# Patient Record
Sex: Female | Born: 1955 | Race: Black or African American | Hispanic: No | Smoking: Current every day smoker
Health system: Southern US, Community
[De-identification: ages and names within clinical notes are randomized; demographics above are authoritative.]

## PROBLEM LIST (undated history)

## (undated) DIAGNOSIS — I10 Essential (primary) hypertension: Secondary | ICD-10-CM

## (undated) DIAGNOSIS — E7800 Pure hypercholesterolemia, unspecified: Secondary | ICD-10-CM

## (undated) HISTORY — PX: VAGINAL HYSTERECTOMY: SUR661

## (undated) HISTORY — DX: Essential (primary) hypertension: I10

## (undated) HISTORY — PX: TONSILLECTOMY: SUR1361

## (undated) HISTORY — DX: Pure hypercholesterolemia, unspecified: E78.00

---

## 2011-03-05 ENCOUNTER — Other Ambulatory Visit: Payer: Self-pay | Admitting: Obstetrics and Gynecology

## 2011-03-05 DIAGNOSIS — Z139 Encounter for screening, unspecified: Secondary | ICD-10-CM

## 2011-03-15 ENCOUNTER — Ambulatory Visit (HOSPITAL_COMMUNITY): Payer: Self-pay

## 2011-03-30 ENCOUNTER — Ambulatory Visit (HOSPITAL_COMMUNITY): Payer: Self-pay

## 2011-03-30 ENCOUNTER — Other Ambulatory Visit: Payer: Self-pay | Admitting: Obstetrics and Gynecology

## 2011-03-30 ENCOUNTER — Ambulatory Visit (HOSPITAL_COMMUNITY)
Admission: RE | Admit: 2011-03-30 | Discharge: 2011-03-30 | Disposition: A | Discharge status: Home / Self Care | Payer: Self-pay | Source: Ambulatory Visit | Attending: Obstetrics and Gynecology | Admitting: Obstetrics and Gynecology

## 2011-03-30 DIAGNOSIS — Z1231 Encounter for screening mammogram for malignant neoplasm of breast: Secondary | ICD-10-CM | POA: Insufficient documentation

## 2011-03-30 DIAGNOSIS — Z139 Encounter for screening, unspecified: Secondary | ICD-10-CM

## 2021-08-29 DIAGNOSIS — J449 Chronic obstructive pulmonary disease, unspecified: Secondary | ICD-10-CM | POA: Diagnosis not present

## 2021-08-29 DIAGNOSIS — I10 Essential (primary) hypertension: Secondary | ICD-10-CM | POA: Diagnosis not present

## 2021-10-30 DIAGNOSIS — I10 Essential (primary) hypertension: Secondary | ICD-10-CM | POA: Diagnosis not present

## 2021-10-30 DIAGNOSIS — F17200 Nicotine dependence, unspecified, uncomplicated: Secondary | ICD-10-CM | POA: Diagnosis not present

## 2021-11-27 DIAGNOSIS — F17200 Nicotine dependence, unspecified, uncomplicated: Secondary | ICD-10-CM | POA: Diagnosis not present

## 2021-11-27 DIAGNOSIS — I10 Essential (primary) hypertension: Secondary | ICD-10-CM | POA: Diagnosis not present

## 2022-01-04 ENCOUNTER — Other Ambulatory Visit (HOSPITAL_COMMUNITY): Payer: Self-pay | Admitting: Internal Medicine

## 2022-01-04 DIAGNOSIS — Z1231 Encounter for screening mammogram for malignant neoplasm of breast: Secondary | ICD-10-CM

## 2022-01-04 DIAGNOSIS — I10 Essential (primary) hypertension: Secondary | ICD-10-CM | POA: Diagnosis not present

## 2022-01-04 DIAGNOSIS — J449 Chronic obstructive pulmonary disease, unspecified: Secondary | ICD-10-CM | POA: Diagnosis not present

## 2022-01-04 DIAGNOSIS — E782 Mixed hyperlipidemia: Secondary | ICD-10-CM | POA: Diagnosis not present

## 2022-01-04 DIAGNOSIS — Z0001 Encounter for general adult medical examination with abnormal findings: Secondary | ICD-10-CM | POA: Diagnosis not present

## 2022-01-04 DIAGNOSIS — F17210 Nicotine dependence, cigarettes, uncomplicated: Secondary | ICD-10-CM | POA: Diagnosis not present

## 2022-01-04 DIAGNOSIS — Z1389 Encounter for screening for other disorder: Secondary | ICD-10-CM | POA: Diagnosis not present

## 2022-01-25 ENCOUNTER — Ambulatory Visit (HOSPITAL_COMMUNITY)
Admission: RE | Admit: 2022-01-25 | Discharge: 2022-01-25 | Disposition: A | Discharge status: Home / Self Care | Payer: Medicare Other | Source: Ambulatory Visit | Attending: Internal Medicine | Admitting: Internal Medicine

## 2022-01-25 ENCOUNTER — Ambulatory Visit (HOSPITAL_COMMUNITY): Payer: Self-pay

## 2022-01-25 DIAGNOSIS — E785 Hyperlipidemia, unspecified: Secondary | ICD-10-CM | POA: Diagnosis not present

## 2022-01-25 DIAGNOSIS — Z0001 Encounter for general adult medical examination with abnormal findings: Secondary | ICD-10-CM | POA: Diagnosis not present

## 2022-01-25 DIAGNOSIS — R7303 Prediabetes: Secondary | ICD-10-CM | POA: Diagnosis not present

## 2022-01-25 DIAGNOSIS — Z1159 Encounter for screening for other viral diseases: Secondary | ICD-10-CM | POA: Diagnosis not present

## 2022-01-25 DIAGNOSIS — Z1231 Encounter for screening mammogram for malignant neoplasm of breast: Secondary | ICD-10-CM | POA: Insufficient documentation

## 2022-01-25 DIAGNOSIS — I10 Essential (primary) hypertension: Secondary | ICD-10-CM | POA: Diagnosis not present

## 2022-03-06 DIAGNOSIS — J449 Chronic obstructive pulmonary disease, unspecified: Secondary | ICD-10-CM | POA: Diagnosis not present

## 2022-03-06 DIAGNOSIS — I10 Essential (primary) hypertension: Secondary | ICD-10-CM | POA: Diagnosis not present

## 2022-03-20 DIAGNOSIS — H6092 Unspecified otitis externa, left ear: Secondary | ICD-10-CM | POA: Diagnosis not present

## 2022-03-20 DIAGNOSIS — J309 Allergic rhinitis, unspecified: Secondary | ICD-10-CM | POA: Diagnosis not present

## 2022-04-17 ENCOUNTER — Encounter: Payer: Self-pay | Admitting: *Deleted

## 2022-04-19 DIAGNOSIS — E785 Hyperlipidemia, unspecified: Secondary | ICD-10-CM | POA: Diagnosis not present

## 2022-04-19 DIAGNOSIS — I10 Essential (primary) hypertension: Secondary | ICD-10-CM | POA: Diagnosis not present

## 2022-05-17 ENCOUNTER — Ambulatory Visit (INDEPENDENT_AMBULATORY_CARE_PROVIDER_SITE_OTHER): Payer: Medicare Other | Admitting: Obstetrics & Gynecology

## 2022-05-17 ENCOUNTER — Encounter: Payer: Self-pay | Admitting: Obstetrics & Gynecology

## 2022-05-17 VITALS — BP 128/69 | HR 85 | Wt 172.6 lb

## 2022-05-17 DIAGNOSIS — Z01419 Encounter for gynecological examination (general) (routine) without abnormal findings: Secondary | ICD-10-CM

## 2022-05-17 NOTE — Progress Notes (Signed)
   WELL-WOMAN EXAMINATION Patient name: Sherry House MRN [id number]  Date of birth: 1956-04-24 Chief Complaint:   Gynecologic Exam  History of Present Illness:   Sherry House is a 66 y.o. G2P2002 PM, PH female being seen today for a routine well-woman exam.  Today she notes no acute complaints or concerns from a gynecologic perspective. Prior hysterectomy completed vaginally due to uterine fibroids.   No LMP recorded. Patient has had a hysterectomy.   Last pap not sure.  Last mammogram: 01/2022- [facility]. I Last colonoscopy: in the process      No data to display            Review of Systems:   Pertinent items are noted in HPI Denies any headaches, blurred vision, fatigue, shortness of breath, chest pain, abdominal pain, bowel movements, urination, or intercourse unless otherwise stated above.  Pertinent History Reviewed:  Reviewed past medical,surgical, social and family history.  Reviewed problem list, medications and allergies. Physical Assessment:   Vitals:   05/17/22 1422  BP: 128/69  Pulse: 85  Weight: 172 lb 9.6 oz (78.3 kg)  There is no height or weight on file to calculate BMI.        Physical Examination:   General appearance - well appearing, and in no distress  Mental status - alert, oriented to person, place, and time  Psych:  She has a normal mood and affect  Skin - warm and dry, normal color, no suspicious lesions noted  Chest - effort normal, all lung fields clear to auscultation bilaterally  Heart - normal rate and regular rhythm  Neck:  midline trachea, no thyromegaly or nodules  Breasts - breasts appear normal, no suspicious masses, no skin or nipple changes or  axillary nodes  Abdomen - soft, nontender, nondistended, no masses or organomegaly  Pelvic - VULVA: normal appearing vulva with no masses, tenderness or lesions  VAGINA: normal appearing vagina with normal color and discharge, no lesions  Uterus and cervix surgically absent  ADNEXA: No  adnexal masses or tenderness noted.  Extremities:  No swelling or varicosities noted  Chaperone:  pt declined      Assessment & Plan:  1) Well-Woman Exam Pap not indicated due to prior hysterectomy for benign conditions Mammogram up to date  Meds: No orders of the defined types were placed in this encounter.   Follow-up: Return in about 2 years (around 05/17/2024) for prn or 2 yr annual.   Sherry Hidalgo, DO Attending Obstetrician & Gynecologist, [facility] for [employer], [facility]

## 2022-05-20 DIAGNOSIS — I10 Essential (primary) hypertension: Secondary | ICD-10-CM | POA: Diagnosis not present

## 2022-05-20 DIAGNOSIS — J449 Chronic obstructive pulmonary disease, unspecified: Secondary | ICD-10-CM | POA: Diagnosis not present

## 2022-06-11 ENCOUNTER — Encounter: Payer: Self-pay | Admitting: *Deleted

## 2022-06-11 NOTE — Patient Instructions (Signed)
  Procedure: Colonoscopy  Estimated body mass index is 27.25 kg/m as calculated from the following:   Height as of this encounter: 5\' 7"  (1.702 m).   Weight as of this encounter: 174 lb (78.9 kg).   Have you had a colonoscopy before?  no  Do you have family history of colon cancer  no  Do you have a family history of polyps? yes  Previous colonoscopy with polyps removed?   Do you have a history colorectal cancer?   no  Are you diabetic?  no  Do you have a prosthetic or mechanical heart valve? no  Do you have a pacemaker/defibrillator?   no  Have you had endocarditis/atrial fibrillation?  no  Do you use supplemental oxygen/CPAP?  no  Have you had joint replacement within the last 12 months?  no  Do you tend to be constipated or have to use laxatives?     Do you have history of alcohol use? If yes, how much and how often.  no  Do you have history or are you using drugs? If yes, what do are you  using?  no  Have you ever had a stroke/heart attack?  no  Have you ever had a heart or other vascular stent placed,?no  Do you take weight loss medication? no  female patients,: have you had a hysterectomy? yes                              are you post menopausal?                                do you still have your menstrual cycle? no    Date of last menstrual period.   Do you take any blood-thinning medications such as: (Plavix, aspirin, Coumadin, Aggrenox, Brilinta, Xarelto, Eliquis, Pradaxa, Savaysa or Effient) no  If yes we need the name, milligram, dosage and who is prescribing doctor:               Current Outpatient Medications  Medication Sig Dispense Refill   albuterol (VENTOLIN HFA) 108 (90 Base) MCG/ACT inhaler SMARTSIG:1 Puff(s) By Mouth 4 Times Daily PRN     atorvastatin (LIPITOR) 10 MG tablet Take 10 mg by mouth daily.     gabapentin (NEURONTIN) 100 MG capsule Take 100 mg by mouth at bedtime.     loratadine (CLARITIN) 10 MG tablet Take 10 mg by mouth daily.      losartan (COZAAR) 50 MG tablet Take 50 mg by mouth daily.     Omega-3 1000 MG CAPS Take by mouth.     rosuvastatin (CRESTOR) 10 MG tablet Take 10 mg by mouth at bedtime.     No current facility-administered medications for this visit.    No Known Allergies

## 2022-06-20 DIAGNOSIS — J449 Chronic obstructive pulmonary disease, unspecified: Secondary | ICD-10-CM | POA: Diagnosis not present

## 2022-06-20 DIAGNOSIS — I10 Essential (primary) hypertension: Secondary | ICD-10-CM | POA: Diagnosis not present

## 2022-06-20 NOTE — Progress Notes (Signed)
LMTRC

## 2022-06-20 NOTE — Progress Notes (Signed)
ASA 2, ok to schedule.  

## 2022-07-02 ENCOUNTER — Encounter: Payer: Self-pay | Admitting: *Deleted

## 2022-07-02 NOTE — Progress Notes (Signed)
Tried calling multiple times, rings once then disconnects. Letter mailed

## 2022-07-19 DIAGNOSIS — E785 Hyperlipidemia, unspecified: Secondary | ICD-10-CM | POA: Diagnosis not present

## 2022-07-19 DIAGNOSIS — I10 Essential (primary) hypertension: Secondary | ICD-10-CM | POA: Diagnosis not present

## 2022-07-19 DIAGNOSIS — F17210 Nicotine dependence, cigarettes, uncomplicated: Secondary | ICD-10-CM | POA: Diagnosis not present

## 2022-07-19 DIAGNOSIS — J449 Chronic obstructive pulmonary disease, unspecified: Secondary | ICD-10-CM | POA: Diagnosis not present

## 2022-07-19 DIAGNOSIS — F17200 Nicotine dependence, unspecified, uncomplicated: Secondary | ICD-10-CM | POA: Diagnosis not present

## 2022-07-19 DIAGNOSIS — Z23 Encounter for immunization: Secondary | ICD-10-CM | POA: Diagnosis not present

## 2022-08-19 DIAGNOSIS — I10 Essential (primary) hypertension: Secondary | ICD-10-CM | POA: Diagnosis not present

## 2022-08-19 DIAGNOSIS — E785 Hyperlipidemia, unspecified: Secondary | ICD-10-CM | POA: Diagnosis not present

## 2022-09-18 DIAGNOSIS — I10 Essential (primary) hypertension: Secondary | ICD-10-CM | POA: Diagnosis not present

## 2022-09-18 DIAGNOSIS — J449 Chronic obstructive pulmonary disease, unspecified: Secondary | ICD-10-CM | POA: Diagnosis not present

## 2022-10-02 ENCOUNTER — Encounter: Payer: Self-pay | Admitting: *Deleted

## 2023-02-15 ENCOUNTER — Other Ambulatory Visit (HOSPITAL_COMMUNITY): Payer: Self-pay | Admitting: Gerontology

## 2023-02-15 ENCOUNTER — Ambulatory Visit (HOSPITAL_COMMUNITY)
Admission: RE | Admit: 2023-02-15 | Discharge: 2023-02-15 | Disposition: A | Discharge status: Home / Self Care | Payer: 59 | Source: Ambulatory Visit | Attending: Gerontology | Admitting: Gerontology

## 2023-02-15 ENCOUNTER — Other Ambulatory Visit (HOSPITAL_COMMUNITY)
Admission: RE | Admit: 2023-02-15 | Discharge: 2023-02-15 | Disposition: A | Discharge status: Home / Self Care | Payer: 59 | Source: Ambulatory Visit | Attending: Internal Medicine | Admitting: Internal Medicine

## 2023-02-15 DIAGNOSIS — I10 Essential (primary) hypertension: Secondary | ICD-10-CM | POA: Diagnosis not present

## 2023-02-15 DIAGNOSIS — J449 Chronic obstructive pulmonary disease, unspecified: Secondary | ICD-10-CM | POA: Diagnosis not present

## 2023-02-15 DIAGNOSIS — R7303 Prediabetes: Secondary | ICD-10-CM | POA: Diagnosis not present

## 2023-02-15 DIAGNOSIS — R059 Cough, unspecified: Secondary | ICD-10-CM | POA: Insufficient documentation

## 2023-02-15 DIAGNOSIS — Z1231 Encounter for screening mammogram for malignant neoplasm of breast: Secondary | ICD-10-CM

## 2023-02-15 LAB — CBC WITH DIFFERENTIAL/PLATELET
Abs Immature Granulocytes: 0.15 10*3/uL — ABNORMAL HIGH (ref 0.00–0.07)
Basophils Absolute: 0.1 10*3/uL (ref 0.0–0.1)
Basophils Relative: 1 %
Eosinophils Absolute: 0.2 10*3/uL (ref 0.0–0.5)
Eosinophils Relative: 2 %
HCT: 39.6 % (ref 36.0–46.0)
Hemoglobin: 13.2 g/dL (ref 12.0–15.0)
Immature Granulocytes: 1 %
Lymphocytes Relative: 36 %
Lymphs Abs: 3.8 10*3/uL (ref 0.7–4.0)
MCH: 27.6 pg (ref 26.0–34.0)
MCHC: 33.3 g/dL (ref 30.0–36.0)
MCV: 82.8 fL (ref 80.0–100.0)
Monocytes Absolute: 0.6 10*3/uL (ref 0.1–1.0)
Monocytes Relative: 5 %
Neutro Abs: 5.8 10*3/uL (ref 1.7–7.7)
Neutrophils Relative %: 55 %
Platelets: 256 10*3/uL (ref 150–400)
RBC: 4.78 MIL/uL (ref 3.87–5.11)
RDW: 13.6 % (ref 11.5–15.5)
WBC: 10.5 10*3/uL (ref 4.0–10.5)
nRBC: 0 % (ref 0.0–0.2)

## 2023-02-15 LAB — LIPID PANEL
Cholesterol: 142 mg/dL (ref 0–200)
HDL: 28 mg/dL — ABNORMAL LOW (ref >40)
LDL Cholesterol: 73 mg/dL (ref 0–99)
Total CHOL/HDL Ratio: 5.1 RATIO
Triglycerides: 203 mg/dL — ABNORMAL HIGH (ref <150)
VLDL: 41 mg/dL — ABNORMAL HIGH (ref 0–40)

## 2023-02-15 LAB — BASIC METABOLIC PANEL
Anion gap: 10 (ref 5–15)
BUN: 19 mg/dL (ref 8–23)
CO2: 25 mmol/L (ref 22–32)
Calcium: 9.1 mg/dL (ref 8.9–10.3)
Chloride: 101 mmol/L (ref 98–111)
Creatinine, Ser: 0.94 mg/dL (ref 0.44–1.00)
GFR, Estimated: >60 mL/min (ref >60)
Glucose, Bld: 118 mg/dL — ABNORMAL HIGH (ref 70–99)
Potassium: 3.9 mmol/L (ref 3.5–5.1)
Sodium: 136 mmol/L (ref 135–145)

## 2023-02-15 LAB — HEPATIC FUNCTION PANEL
ALT: 16 U/L (ref 0–44)
AST: 18 U/L (ref 15–41)
Albumin: 4.1 g/dL (ref 3.5–5.0)
Alkaline Phosphatase: 81 U/L (ref 38–126)
Bilirubin, Direct: 0.1 mg/dL (ref 0.0–0.2)
Indirect Bilirubin: 0.3 mg/dL (ref 0.3–0.9)
Total Bilirubin: 0.4 mg/dL (ref 0.3–1.2)
Total Protein: 7.4 g/dL (ref 6.5–8.1)

## 2023-02-16 LAB — HEMOGLOBIN A1C
Hgb A1c MFr Bld: 6.8 % — ABNORMAL HIGH (ref 4.8–5.6)
Mean Plasma Glucose: 148 mg/dL

## 2023-02-20 ENCOUNTER — Ambulatory Visit (HOSPITAL_COMMUNITY)
Admission: RE | Admit: 2023-02-20 | Discharge: 2023-02-20 | Disposition: A | Discharge status: Home / Self Care | Payer: 59 | Source: Ambulatory Visit | Attending: Gerontology | Admitting: Gerontology

## 2023-02-20 DIAGNOSIS — Z1231 Encounter for screening mammogram for malignant neoplasm of breast: Secondary | ICD-10-CM | POA: Diagnosis present

## 2023-03-27 ENCOUNTER — Encounter: Payer: Self-pay | Admitting: *Deleted

## 2023-05-22 ENCOUNTER — Other Ambulatory Visit (HOSPITAL_COMMUNITY): Payer: Self-pay | Admitting: Gerontology

## 2023-05-22 DIAGNOSIS — R42 Dizziness and giddiness: Secondary | ICD-10-CM

## 2023-05-28 ENCOUNTER — Ambulatory Visit (HOSPITAL_COMMUNITY): Payer: 59 | Attending: Gerontology

## 2023-05-28 ENCOUNTER — Encounter (HOSPITAL_COMMUNITY): Payer: Self-pay

## 2023-08-13 IMAGING — MG MM DIGITAL SCREENING BILAT W/ TOMO AND CAD
8 series · 8 of 24 positions shown · non-contrast
Comparison: Previous exam(s).

CLINICAL DATA: Screening.

EXAM:
DIGITAL SCREENING BILATERAL MAMMOGRAM WITH TOMOSYNTHESIS AND CAD
TECHNIQUE: Bilateral screening digital craniocaudal and mediolateral oblique
mammograms were obtained. Bilateral screening digital breast
tomosynthesis was performed. The images were evaluated with
computer-aided detection.

[L MLO synth-2D]
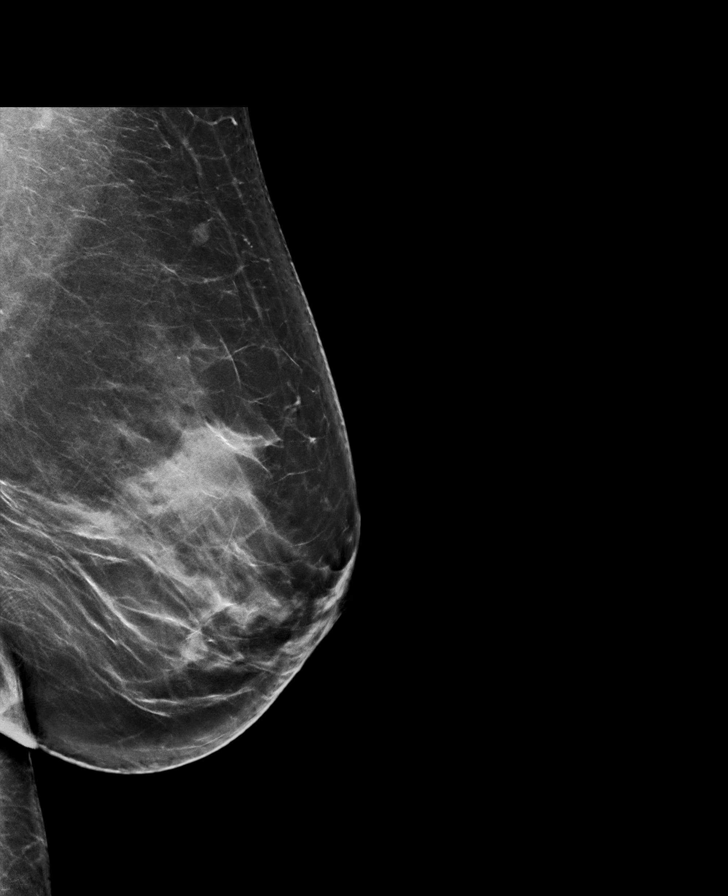

[R CC synth-2D]
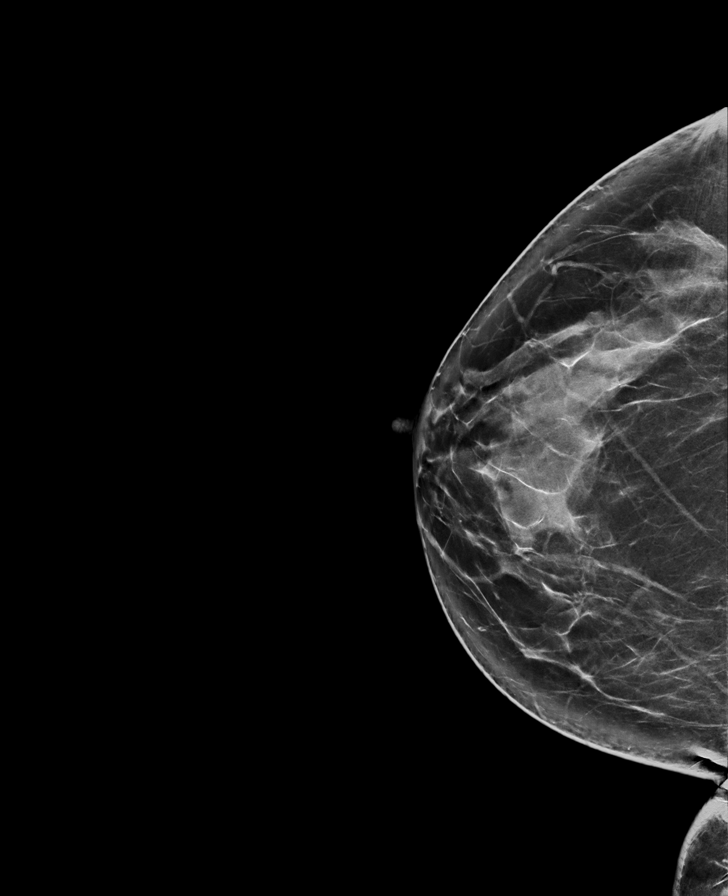

[L CC synth-2D]
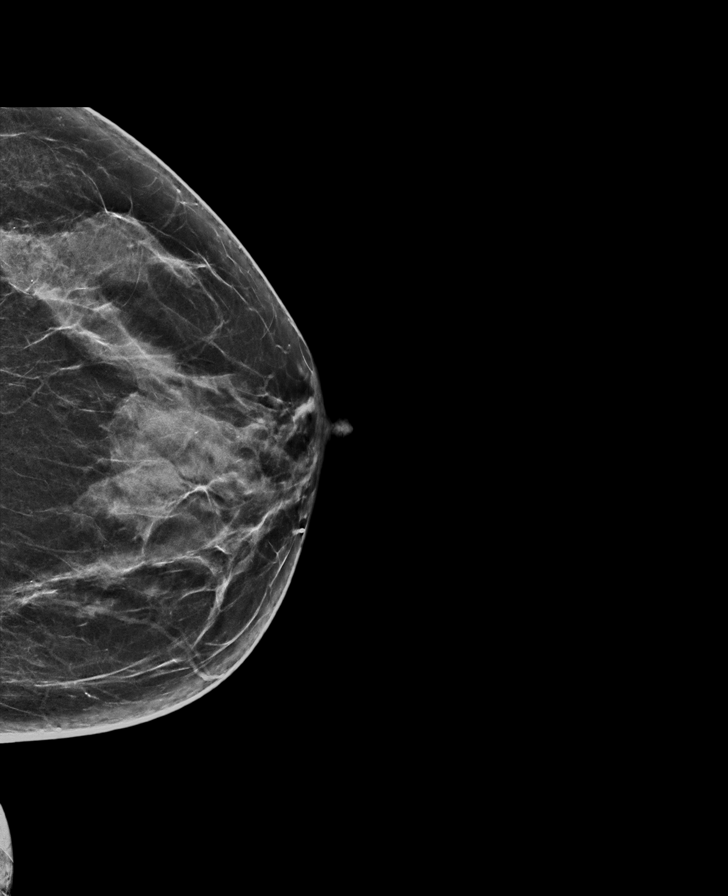

[R MLO synth-2D]
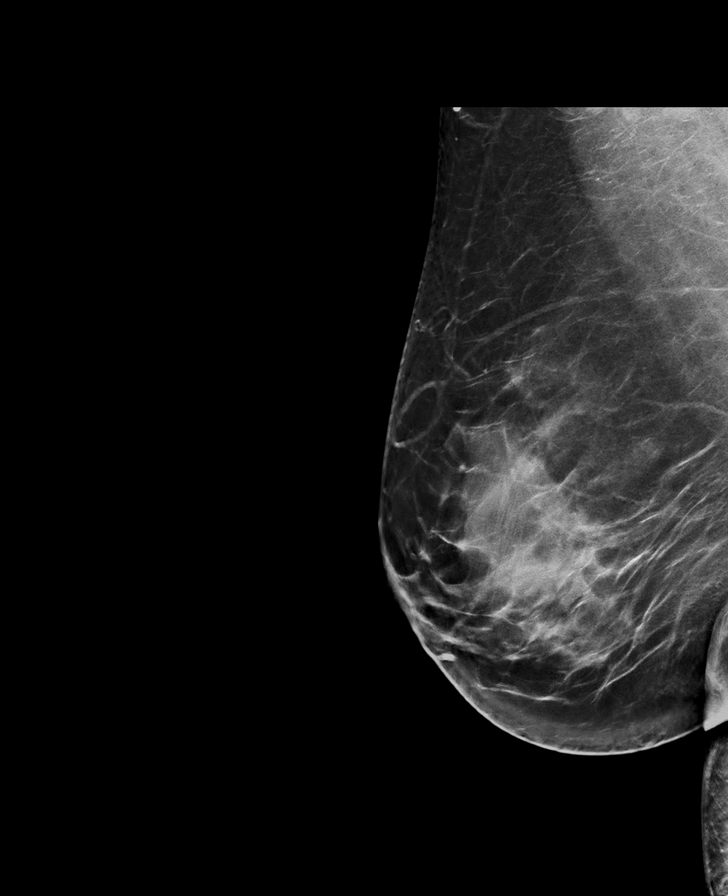

[R CC tomo · tomo slice 39/77.0]
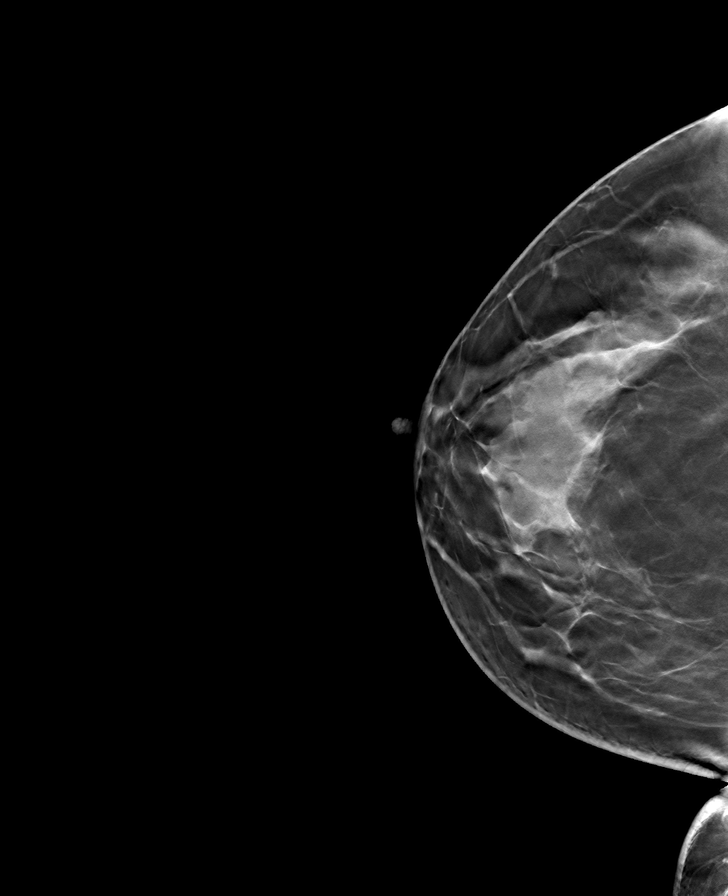

[R MLO tomo · tomo slice 45/90.0]
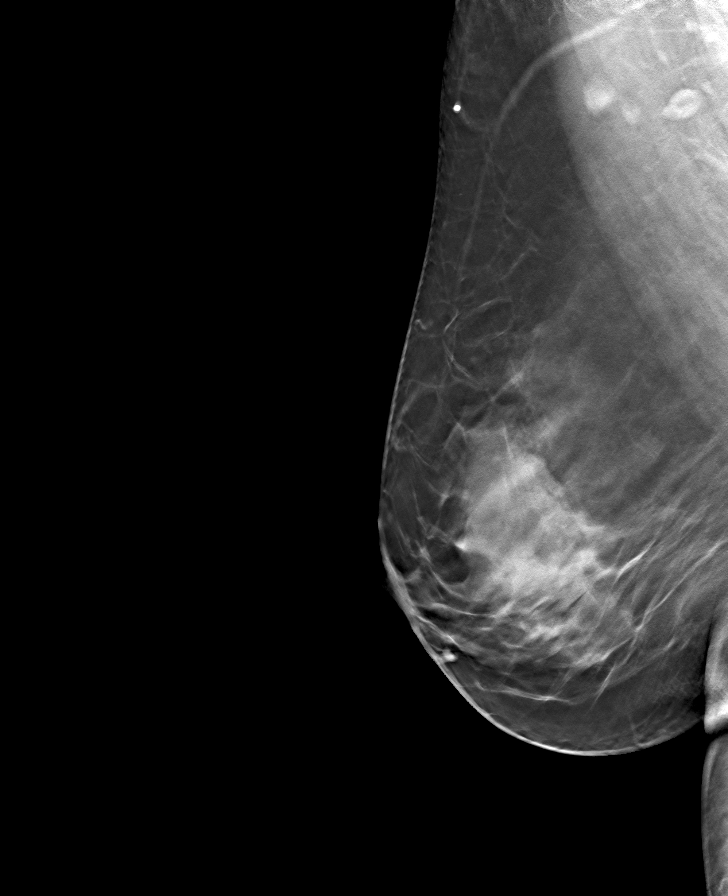

[L CC tomo · tomo slice 34/67.0]
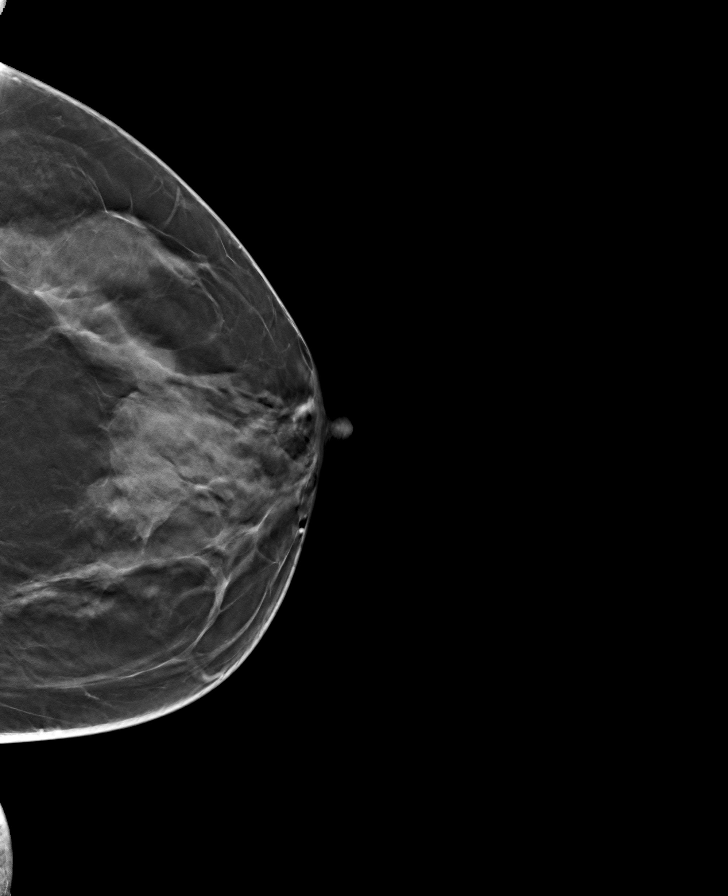

[L MLO tomo · tomo slice 43/86.0]
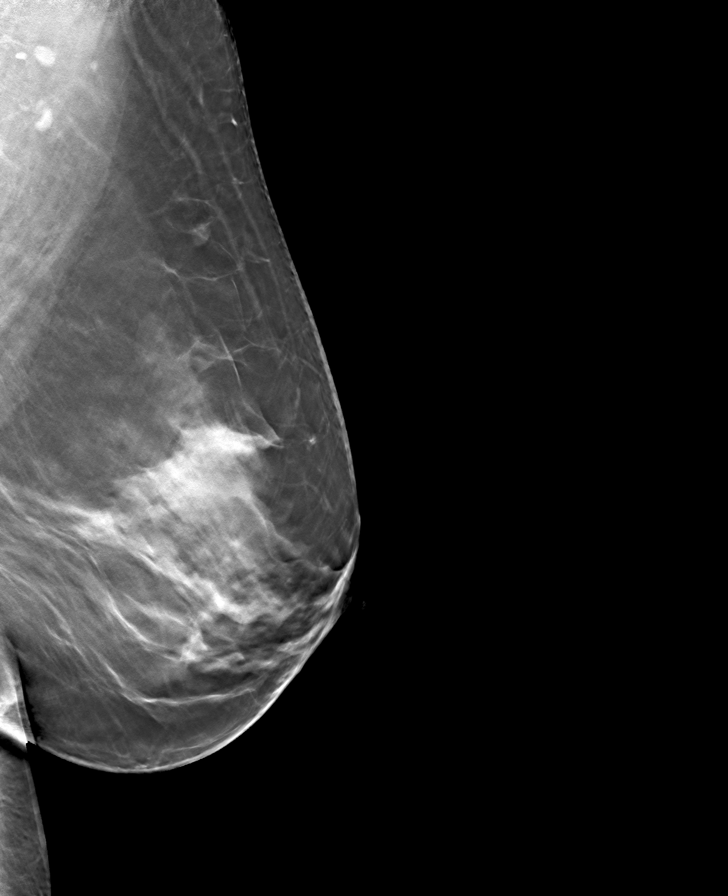

[8 of 24 positions shown; findings below may reference images not displayed]

ACR Breast Density Category c: The breast tissue is heterogeneously
dense, which may obscure small masses.
FINDINGS: There are no findings suspicious for malignancy.
IMPRESSION: No mammographic evidence of malignancy. A result letter of this
screening mammogram will be mailed directly to the patient.

RECOMMENDATION:
Screening mammogram in one year. (Code:Q3-W-BC3)

BI-RADS CATEGORY  1: Negative.

## 2023-10-03 ENCOUNTER — Encounter: Payer: Self-pay | Admitting: *Deleted

## 2023-11-27 DIAGNOSIS — J449 Chronic obstructive pulmonary disease, unspecified: Secondary | ICD-10-CM | POA: Diagnosis not present

## 2023-11-27 DIAGNOSIS — I10 Essential (primary) hypertension: Secondary | ICD-10-CM | POA: Diagnosis not present

## 2023-12-24 ENCOUNTER — Other Ambulatory Visit (HOSPITAL_COMMUNITY): Payer: Self-pay | Admitting: Gerontology

## 2023-12-24 DIAGNOSIS — M129 Arthropathy, unspecified: Secondary | ICD-10-CM | POA: Diagnosis not present

## 2023-12-24 DIAGNOSIS — I10 Essential (primary) hypertension: Secondary | ICD-10-CM | POA: Diagnosis not present

## 2023-12-24 DIAGNOSIS — F17210 Nicotine dependence, cigarettes, uncomplicated: Secondary | ICD-10-CM | POA: Diagnosis not present

## 2023-12-24 DIAGNOSIS — J310 Chronic rhinitis: Secondary | ICD-10-CM | POA: Diagnosis not present

## 2023-12-24 DIAGNOSIS — Z1389 Encounter for screening for other disorder: Secondary | ICD-10-CM | POA: Diagnosis not present

## 2023-12-24 DIAGNOSIS — Z0001 Encounter for general adult medical examination with abnormal findings: Secondary | ICD-10-CM | POA: Diagnosis not present

## 2023-12-24 DIAGNOSIS — J449 Chronic obstructive pulmonary disease, unspecified: Secondary | ICD-10-CM | POA: Diagnosis not present

## 2023-12-24 DIAGNOSIS — Z87891 Personal history of nicotine dependence: Secondary | ICD-10-CM

## 2023-12-24 DIAGNOSIS — E785 Hyperlipidemia, unspecified: Secondary | ICD-10-CM | POA: Diagnosis not present

## 2023-12-24 DIAGNOSIS — F17200 Nicotine dependence, unspecified, uncomplicated: Secondary | ICD-10-CM | POA: Diagnosis not present

## 2024-01-23 DIAGNOSIS — J449 Chronic obstructive pulmonary disease, unspecified: Secondary | ICD-10-CM | POA: Diagnosis not present

## 2024-01-23 DIAGNOSIS — I10 Essential (primary) hypertension: Secondary | ICD-10-CM | POA: Diagnosis not present

## 2024-02-23 DIAGNOSIS — J449 Chronic obstructive pulmonary disease, unspecified: Secondary | ICD-10-CM | POA: Diagnosis not present

## 2024-02-23 DIAGNOSIS — I10 Essential (primary) hypertension: Secondary | ICD-10-CM | POA: Diagnosis not present

## 2024-02-27 DIAGNOSIS — R7303 Prediabetes: Secondary | ICD-10-CM | POA: Diagnosis not present

## 2024-02-27 DIAGNOSIS — Z0001 Encounter for general adult medical examination with abnormal findings: Secondary | ICD-10-CM | POA: Diagnosis not present

## 2024-02-27 DIAGNOSIS — E785 Hyperlipidemia, unspecified: Secondary | ICD-10-CM | POA: Diagnosis not present

## 2024-02-27 DIAGNOSIS — I10 Essential (primary) hypertension: Secondary | ICD-10-CM | POA: Diagnosis not present

## 2024-06-04 DIAGNOSIS — I10 Essential (primary) hypertension: Secondary | ICD-10-CM | POA: Diagnosis not present

## 2024-06-04 DIAGNOSIS — J449 Chronic obstructive pulmonary disease, unspecified: Secondary | ICD-10-CM | POA: Diagnosis not present

## 2024-06-29 DIAGNOSIS — E785 Hyperlipidemia, unspecified: Secondary | ICD-10-CM | POA: Diagnosis not present

## 2024-06-29 DIAGNOSIS — M129 Arthropathy, unspecified: Secondary | ICD-10-CM | POA: Diagnosis not present

## 2024-06-29 DIAGNOSIS — J449 Chronic obstructive pulmonary disease, unspecified: Secondary | ICD-10-CM | POA: Diagnosis not present

## 2024-06-29 DIAGNOSIS — I10 Essential (primary) hypertension: Secondary | ICD-10-CM | POA: Diagnosis not present

## 2024-07-01 DIAGNOSIS — Z23 Encounter for immunization: Secondary | ICD-10-CM | POA: Diagnosis not present

## 2024-10-02 ENCOUNTER — Ambulatory Visit (HOSPITAL_COMMUNITY)
Admission: RE | Admit: 2024-10-02 | Discharge: 2024-10-02 | Disposition: A | Source: Ambulatory Visit | Attending: Gerontology | Admitting: Gerontology

## 2024-10-02 ENCOUNTER — Other Ambulatory Visit (HOSPITAL_COMMUNITY): Payer: Self-pay | Admitting: Gerontology

## 2024-10-02 DIAGNOSIS — M25522 Pain in left elbow: Secondary | ICD-10-CM | POA: Diagnosis present
